# Patient Record
Sex: Female | Born: 2000 | Race: Black or African American | Hispanic: No | Marital: Single | Smoking: Never smoker
Health system: Southern US, Community
[De-identification: ages and names within clinical notes are randomized; demographics above are authoritative.]

---
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
## 2023-03-09 ENCOUNTER — Ambulatory Visit (LOCAL_COMMUNITY_HEALTH_CENTER): Payer: Self-pay

## 2023-03-09 DIAGNOSIS — Z111 Encounter for screening for respiratory tuberculosis: Secondary | ICD-10-CM

## 2023-03-12 ENCOUNTER — Ambulatory Visit (LOCAL_COMMUNITY_HEALTH_CENTER): Payer: 59

## 2023-03-12 ENCOUNTER — Encounter: Payer: Self-pay | Admitting: Obstetrics and Gynecology

## 2023-03-12 DIAGNOSIS — Z111 Encounter for screening for respiratory tuberculosis: Secondary | ICD-10-CM

## 2023-03-12 LAB — TB SKIN TEST
Induration: 0 mm
TB Skin Test: NEGATIVE

## 2023-06-17 ENCOUNTER — Telehealth: Payer: 59 | Admitting: Family

## 2023-06-17 DIAGNOSIS — J069 Acute upper respiratory infection, unspecified: Secondary | ICD-10-CM

## 2023-06-17 MED ORDER — FLUTICASONE PROPIONATE 50 MCG/ACT NA SUSP: 2.0000 | Freq: Every day | NASAL | 6 refills | Status: DC

## 2023-06-17 MED ORDER — BENZONATATE 100 MG PO CAPS: 100.0000 mg | ORAL_CAPSULE | Freq: Three times a day (TID) | ORAL | 0 refills | Status: AC | PRN

## 2023-06-17 NOTE — Patient Instructions (Signed)

## 2023-06-17 NOTE — Progress Notes (Signed)
 Virtual Visit Consent   Penny Ford Staff, you are scheduled for a virtual visit with a [facility] provider today. Just as with appointments in the office, your consent must be obtained to participate. Your consent will be active for this visit and any virtual visit you may have with one of our providers in the next 365 days. If you have a MyChart account, a copy of this consent can be sent to you electronically.  As this is a virtual visit, video technology does not allow for your provider to perform a traditional examination. This may limit your provider's ability to fully assess your condition. If your provider identifies any concerns that need to be evaluated in person or the need to arrange testing (such as labs, EKG, etc.), we will make arrangements to do so. Although advances in technology are sophisticated, we cannot ensure that it will always work on either your end or our end. If the connection with a video visit is poor, the visit may have to be switched to a telephone visit. With either a video or telephone visit, we are not always able to ensure that we have a secure connection.  By engaging in this virtual visit, you consent to the provision of healthcare and authorize for your insurance to be billed (if applicable) for the services provided during this visit. Depending on your insurance coverage, you may receive a charge related to this service.  I need to obtain your verbal consent now. Are you willing to proceed with your visit today? Penny Ford has provided verbal consent on 06/17/2023 for a virtual visit (video or telephone). Jannifer Rodney, FNP  Date: 06/17/2023 4:31 PM   Virtual Visit via Video Note   I, Jannifer Rodney, connected with  Penny Ford  ([id number], 2000/10/20) on 06/17/23 at  4:30 PM EST by a video-enabled telemedicine application and verified that I am speaking with the correct person using two identifiers.  Location: Patient: Virtual Visit Location  Patient: Home Provider: Virtual Visit Location Provider: Home Office   I discussed the limitations of evaluation and management by telemedicine and the availability of in person appointments. The patient expressed understanding and agreed to proceed.    History of Present Illness: Penny Ford is a 23 y.o. who identifies as a female who was assigned female at birth, and is being seen today for fatigue, cough, and light headed that started yesterday.  HPI: Cough This is a new problem. The current episode started yesterday. The problem has been unchanged. The problem occurs every few minutes. The cough is Non-productive. Associated symptoms include headaches, nasal congestion, a sore throat and shortness of breath. Pertinent negatives include no chills, ear congestion, ear pain, fever, myalgias or wheezing. She has tried rest and OTC cough suppressant for the symptoms. The treatment provided mild relief.    Problems:  Patient Active Problem List   Diagnosis Date Noted   Family history of ovarian cancer 04/01/2018    Allergies: No Known Allergies Medications:  Current Outpatient Medications:    benzonatate (TESSALON PERLES) 100 MG capsule, Take 1 capsule (100 mg total) by mouth 3 (three) times daily as needed., Disp: 20 capsule, Rfl: 0   fluticasone (FLONASE) 50 MCG/ACT nasal spray, Place 2 sprays into both nostrils daily., Disp: 16 g, Rfl: 6  Observations/Objective: Patient is well-developed, well-nourished in no acute distress.  Resting comfortably  at home.  Head is normocephalic, atraumatic.  No labored breathing.  Speech is clear and coherent with logical  content.  Patient is alert and oriented at baseline.  Dry nonproductive cough  Assessment and Plan: 1. Viral URI (Primary) - benzonatate (TESSALON PERLES) 100 MG capsule; Take 1 capsule (100 mg total) by mouth 3 (three) times daily as needed.  Dispense: 20 capsule; Refill: 0 - fluticasone (FLONASE) 50 MCG/ACT nasal spray;  Place 2 sprays into both nostrils daily.  Dispense: 16 g; Refill: 6  - Take meds as prescribed - Use a cool mist humidifier  -Use saline nose sprays frequently -Force fluids -For any cough or congestion  Use plain Mucinex- regular strength or max strength is fine -For fever or aces or pains- take tylenol or ibuprofen. -Throat lozenges if help -Follow up if symptoms worsen or do not improve   Follow Up Instructions: I discussed the assessment and treatment plan with the patient. The patient was provided an opportunity to ask questions and all were answered. The patient agreed with the plan and demonstrated an understanding of the instructions.  A copy of instructions were sent to the patient via MyChart unless otherwise noted below.     The patient was advised to call back or seek an in-person evaluation if the symptoms worsen or if the condition fails to improve as anticipated.    Jannifer Rodney, FNP

## 2023-08-12 ENCOUNTER — Telehealth: Admitting: Physician Assistant

## 2023-08-12 ENCOUNTER — Telehealth

## 2023-08-12 DIAGNOSIS — J069 Acute upper respiratory infection, unspecified: Secondary | ICD-10-CM

## 2023-08-12 DIAGNOSIS — Z5321 Procedure and treatment not carried out due to patient leaving prior to being seen by health care provider: Secondary | ICD-10-CM

## 2023-08-12 MED ORDER — CETIRIZINE-PSEUDOEPHEDRINE ER 5-120 MG PO TB12: 1.0000 | ORAL_TABLET | Freq: Two times a day (BID) | ORAL | 0 refills | Status: AC

## 2023-08-12 MED ORDER — FLUTICASONE PROPIONATE 50 MCG/ACT NA SUSP: 2.0000 | Freq: Every day | NASAL | 6 refills | Status: AC

## 2023-08-12 NOTE — Progress Notes (Signed)
 The patient no-showed for appointment despite this provider sending direct link, reaching out via phone with no response and waiting for at least 10 minutes from appointment time for patient to join. They will be marked as a NS for this appointment/time.   Laure Kidney, PA-C

## 2023-08-12 NOTE — Progress Notes (Signed)
 Virtual Visit Consent   Penny Ford, you are scheduled for a virtual visit with a [facility] provider today. Just as with appointments in the office, your consent must be obtained to participate. Your consent will be active for this visit and any virtual visit you may have with one of our providers in the next 365 days. If you have a MyChart account, a copy of this consent can be sent to you electronically.  As this is a virtual visit, video technology does not allow for your provider to perform a traditional examination. This may limit your provider's ability to fully assess your condition. If your provider identifies any concerns that need to be evaluated in person or the need to arrange testing (such as labs, EKG, etc.), we will make arrangements to do so. Although advances in technology are sophisticated, we cannot ensure that it will always work on either your end or our end. If the connection with a video visit is poor, the visit may have to be switched to a telephone visit. With either a video or telephone visit, we are not always able to ensure that we have a secure connection.  By engaging in this virtual visit, you consent to the provision of healthcare and authorize for your insurance to be billed (if applicable) for the services provided during this visit. Depending on your insurance coverage, you may receive a charge related to this service.  I need to obtain your verbal consent now. Are you willing to proceed with your visit today? Penny L Wurster has provided verbal consent on 08/12/2023 for a virtual visit (video or telephone). Penny Ford, [place]  Date: 08/12/2023 6:19 PM   Virtual Visit via Video Note   I, Penny Ford, connected with  ADDILYNNE OLHEISER  ([id number], [birth year]) on 08/12/23 at  6:15 PM EDT by a video-enabled telemedicine application and verified that I am speaking with the correct person using two identifiers.  Location: Patient: Virtual Visit Location  Patient: Home Provider: Virtual Visit Location Provider: Home Office   I discussed the limitations of evaluation and management by telemedicine and the availability of in person appointments. The patient expressed understanding and agreed to proceed.    History of Present Illness: Penny Ford is a 23 y.o. who identifies as a female who was assigned female at birth, and is being seen today for URI.  HPI: Sinus Problem This is a new problem. The current episode started yesterday. The problem is unchanged. There has been no fever. The pain is mild. Associated symptoms include congestion, headaches and sinus pressure. Past treatments include lying down. The treatment provided mild relief.    Problems:  Patient Active Problem List   Diagnosis Date Noted   Family history of ovarian cancer 04/01/2018    Allergies: No Known Allergies Medications:  Current Outpatient Medications:    benzonatate  (TESSALON  PERLES) 100 MG capsule, Take 1 capsule (100 mg total) by mouth 3 (three) times daily as needed., Disp: 20 capsule, Rfl: 0   fluticasone  (FLONASE ) 50 MCG/ACT nasal spray, Place 2 sprays into both nostrils daily., Disp: 16 g, Rfl: 6  Observations/Objective: Patient is well-developed, well-nourished in no acute distress.  Resting comfortably  at home.  Head is normocephalic, atraumatic.  No labored breathing.  Speech is clear and coherent with logical content.  Patient is alert and oriented at baseline.    Assessment and Plan: 1. Viral URI (Primary)    Patient presents with symptoms suspicious for viral URI.Differentials include bacterial  pneumonia, sinusitis, allergic rhinitis. Do not suspect underlying cardiopulmonary process. I considered, but think unlikely, dangerous causes of this patient's symptoms to include ACS, CHF or COPD exacerbations, pneumonia, pneumothorax. Patient is nontoxic appearing and not in need of emergent medical intervention.  Follow Up Instructions: I  discussed the assessment and treatment plan with the patient. The patient was provided an opportunity to ask questions and all were answered. The patient agreed with the plan and demonstrated an understanding of the instructions.  A copy of instructions were sent to the patient via MyChart unless otherwise noted below.     The patient was advised to call back or seek an in-person evaluation if the symptoms worsen or if the condition fails to improve as anticipated.    Penny Settle, PA-C

## 2023-08-12 NOTE — Patient Instructions (Signed)
  Penny Ford, thank you for joining Penny Settle, PA-C for today's virtual visit.  While this provider is not your primary care provider (PCP), if your PCP is located in our provider database this encounter information will be shared with them immediately following your visit.   A [facility] account gives you access to today's visit and all your visits, tests, and labs performed at [facility] " click here if you don't have a Penny Ford account or go to [email or web address]  Consent: (Patient) Penny Ford provided verbal consent for this virtual visit at the beginning of the encounter.  Current Medications:  Current Outpatient Medications:    benzonatate  (TESSALON  PERLES) 100 MG capsule, Take 1 capsule (100 mg total) by mouth 3 (three) times daily as needed., Disp: 20 capsule, Rfl: 0   fluticasone  (FLONASE ) 50 MCG/ACT nasal spray, Place 2 sprays into both nostrils daily., Disp: 16 g, Rfl: 6   Medications ordered in this encounter:  No orders of the defined types were placed in this encounter.    *If you need refills on other medications prior to your next appointment, please contact your pharmacy*  Follow-Up: Call back or seek an in-person evaluation if the symptoms worsen or if the condition fails to improve as anticipated.  [facility] [phone]  Other Instructions Please report to the nearest Emergency room with any worsening symptoms. Follow up with primary care provider (PCP) in 2 -3 days.    If you have been instructed to have an in-person evaluation today at a local Urgent Care facility, please use the link below. It will take you to a list of all of our available [facility], including address, phone number and hours of operation. Please do not delay care.  [name]  If you or a family member do not have a primary care provider, use the link below to schedule a visit and establish  care. When you choose a [facility] primary care physician or advanced practice provider, you gain a long-term partner in health. Find a Primary Care Provider  Learn more about [facility]'s in-office and virtual care options:  - Get Care Now

## 2023-09-13 ENCOUNTER — Encounter: Payer: Self-pay | Admitting: Nurse Practitioner

## 2023-09-13 ENCOUNTER — Telehealth

## 2023-09-13 ENCOUNTER — Telehealth: Admitting: Nurse Practitioner

## 2023-09-13 DIAGNOSIS — R42 Dizziness and giddiness: Secondary | ICD-10-CM | POA: Diagnosis not present

## 2023-09-13 DIAGNOSIS — R55 Syncope and collapse: Secondary | ICD-10-CM

## 2023-09-13 NOTE — Patient Instructions (Signed)
  Penny Ford, thank you for joining Collins Dean, NP for today's virtual visit.  While this provider is not your primary care provider (PCP), if your PCP is located in our provider database this encounter information will be shared with them immediately following your visit.   A [facility] MyChart account gives you access to today's visit and all your visits, tests, and labs performed at [facility] " click here if you don't have a [facility] MyChart account or go to [email or web address]  Consent: (Patient) Penny Ford provided verbal consent for this virtual visit at the beginning of the encounter.  Current Medications:  Current Outpatient Medications:    benzonatate  (TESSALON  PERLES) 100 MG capsule, Take 1 capsule (100 mg total) by mouth 3 (three) times daily as needed., Disp: 20 capsule, Rfl: 0   fluticasone  (FLONASE ) 50 MCG/ACT nasal spray, Place 2 sprays into both nostrils daily., Disp: 16 g, Rfl: 6   Medications ordered in this encounter:  No orders of the defined types were placed in this encounter.    *If you need refills on other medications prior to your next appointment, please contact your pharmacy*  Follow-Up: Call back or seek an in-person evaluation if the symptoms worsen or if the condition fails to improve as anticipated.  [facility] [phone]  Other Instructions Needs to establish with PCP for blood work, blood pressure monitoring and urinalysis   If you have been instructed to have an in-person evaluation today at a local Urgent Care facility, please use the link below. It will take you to a list of all of our available [facility], including address, phone number and hours of operation. Please do not delay care.  [facility]  If you or a family member do not have a primary care provider, use the link below to schedule a visit and establish care. When you choose a [facility] primary  care physician or advanced practice provider, you gain a long-term partner in health. Find a Primary Care Provider  Learn more about 's in-office and virtual care options:  - Get Care Now

## 2023-09-13 NOTE — Progress Notes (Signed)
 Virtual Visit Consent   Penny Ford, you are scheduled for a virtual visit with a [facility] provider today. Just as with appointments in the office, your consent must be obtained to participate. Your consent will be active for this visit and any virtual visit you may have with one of our providers in the next 365 days. If you have a MyChart account, a copy of this consent can be sent to you electronically.  As this is a virtual visit, video technology does not allow for your provider to perform a traditional examination. This may limit your provider's ability to fully assess your condition. If your provider identifies any concerns that need to be evaluated in person or the need to arrange testing (such as labs, EKG, etc.), we will make arrangements to do so. Although advances in technology are sophisticated, we cannot ensure that it will always work on either your end or our end. If the connection with a video visit is poor, the visit may have to be switched to a telephone visit. With either a video or telephone visit, we are not always able to ensure that we have a secure connection.  By engaging in this virtual visit, you consent to the provision of healthcare and authorize for your insurance to be billed (if applicable) for the services provided during this visit. Depending on your insurance coverage, you may receive a charge related to this service.  I need to obtain your verbal consent now. Are you willing to proceed with your visit today? Penny Ford has provided verbal consent on 09/13/2023 for a virtual visit (video or telephone). Collins Dean, NP  Date: 09/13/2023 10:52 AM   Virtual Visit via Video Note   I, Collins Dean, connected with  Penny Ford  ([id number], Feb 19, 2001) on 09/13/23 at 10:45 AM EDT by a video-enabled telemedicine application and verified that I am speaking with the correct person using two identifiers.  Location: Patient: Virtual Visit  Location Patient: Home Provider: Virtual Visit Location Provider: Home Office   I discussed the limitations of evaluation and management by telemedicine and the availability of in person appointments. The patient expressed understanding and agreed to proceed.    History of Present Illness: Penny Ford is a 23 y.o. who identifies as a female who was assigned female at birth, and is being seen today for presyncope.  Ms Swanner states for the past year she has been experiencing symptoms of lightheadedness when she first wakes up from lying to sitting or sitting to standing. She brushed her symptoms off because they usually only lasted for a few seconds.  However yesterday at work while she was standing she felt lightheaded as if she was going to pass out along with nausea and this lasted for 2 hours.  She states she was only standing when this occurred and it was not associated with any other activity.     Problems:  Patient Active Problem List   Diagnosis Date Noted   Family history of ovarian cancer 04/01/2018    Allergies: No Known Allergies Medications:  Current Outpatient Medications:    benzonatate  (TESSALON  PERLES) 100 MG capsule, Take 1 capsule (100 mg total) by mouth 3 (three) times daily as needed., Disp: 20 capsule, Rfl: 0   fluticasone  (FLONASE ) 50 MCG/ACT nasal spray, Place 2 sprays into both nostrils daily., Disp: 16 g, Rfl: 6  Observations/Objective: Patient is well-developed, well-nourished in no acute distress.  Resting comfortably  at home.  Head  is normocephalic, atraumatic.  No labored breathing.  Speech is clear and coherent with logical content.  Patient is alert and oriented at baseline.    Assessment and Plan: 1. Postural dizziness with presyncope (Primary) Needs to establish with PCP for blood work, blood pressure monitoring and urinalysis   Follow Up Instructions: I discussed the assessment and treatment plan with the patient. The patient was  provided an opportunity to ask questions and all were answered. The patient agreed with the plan and demonstrated an understanding of the instructions.  A copy of instructions were sent to the patient via MyChart unless otherwise noted below.   The patient was advised to call back or seek an in-person evaluation if the symptoms worsen or if the condition fails to improve as anticipated.    Rainier Feuerborn W Sadrac Zeoli, NP

## 2023-09-14 ENCOUNTER — Ambulatory Visit
Admission: RE | Admit: 2023-09-14 | Discharge: 2023-09-14 | Disposition: A | Discharge status: Home / Self Care | Source: Ambulatory Visit | Attending: Emergency Medicine | Admitting: Emergency Medicine

## 2023-09-14 VITALS — BP 118/84 | HR 82 | Temp 99.0°F | Resp 16

## 2023-09-14 DIAGNOSIS — R42 Dizziness and giddiness: Secondary | ICD-10-CM | POA: Diagnosis not present

## 2023-09-14 MED ORDER — MECLIZINE HCL 12.5 MG PO TABS: 12.5000 mg | ORAL_TABLET | Freq: Three times a day (TID) | ORAL | 0 refills | Status: AC | PRN

## 2023-09-14 NOTE — ED Provider Notes (Signed)
 [facility]    CSN: [id number] Arrival date & time: 09/14/23  0904      History   Chief Complaint Chief Complaint  Patient presents with   Dizziness    When I stand up sometimes,I get nauseous and dizzy then I feel like I'm about to pass out sometimes it only last for a minute but while I was at work it lasted for hours - Entered by patient    HPI Penny Ford is a 23 y.o. female.   Patient presents for evaluation of intermittent dizziness present for 2 years.  Described as feeling off balance as well as the room spinning, associated visual disturbance described as Penny Ford to sparkly spots and blackening vision.  Typically occurs independent of movement, last approximately 1 minute before spontaneous resolution.  2 days ago symptoms were brought on while at work, lasting for 2 hours before resolution, felt as if she was going to pass out but denies syncopal episode.  In the past has attempted use of iron pills which provided no relief.  Endorses no change in activity, diet, medications.  Endorses eating prior to prolonged events.  First evaluation.  History reviewed. No pertinent past medical history.  Patient Active Problem List   Diagnosis Date Noted   Family history of ovarian cancer 04/01/2018    History reviewed. No pertinent surgical history.  OB History   No obstetric history on file.      Home Medications    Prior to Admission medications   Medication Sig Start Date End Date Taking? Authorizing Provider  acyclovir (ZOVIRAX) 800 MG tablet Take 800 mg by mouth 5 (five) times daily.   Yes [provider]  meclizine (ANTIVERT) 12.5 MG tablet Take 1 tablet (12.5 mg total) by mouth 3 (three) times daily as needed for dizziness. 09/14/23  Yes Taylan Mayhan R, NP  benzonatate  (TESSALON  PERLES) 100 MG capsule Take 1 capsule (100 mg total) by mouth 3 (three) times daily as needed. 06/17/23   Yevette Hem, FNP  fluticasone  (FLONASE ) 50 MCG/ACT nasal  spray Place 2 sprays into both nostrils daily. 08/12/23   [name], PA-C    Family History Family History  Problem Relation Age of Onset   Diabetes Mother    Cancer Maternal Grandmother        ovarian   Breast cancer Neg Hx     Social History Social History   Tobacco Use   Smoking status: Never   Smokeless tobacco: Never  Vaping Use   Vaping status: Never Used  Substance Use Topics   Alcohol use: Never   Drug use: Never     Allergies   Patient has no known allergies.   Review of Systems Review of Systems   Physical Exam Triage Vital Signs ED Triage Vitals  Encounter Vitals Group     BP 09/14/23 0940 118/84     Systolic BP Percentile --      Diastolic BP Percentile --      Pulse Rate 09/14/23 0940 82     Resp 09/14/23 0940 16     Temp 09/14/23 0940 99 F (37.2 C)     Temp Source 09/14/23 0940 Oral     SpO2 09/14/23 0940 98 %     Weight --      Height --      Head Circumference --      Peak Flow --      Pain Score 09/14/23 0941 0     Pain  Loc --      Pain Education --      Exclude from Growth Chart --    Orthostatic VS for the past 24 hrs:  BP- Lying Pulse- Lying BP- Sitting Pulse- Sitting BP- Standing at 0 minutes Pulse- Standing at 0 minutes  09/14/23 0945 111/73 79 115/75 92 131/84 108    Updated Vital Signs BP 118/84 (BP Location: Left Arm)   Pulse 82   Temp 99 F (37.2 C) (Oral)   Resp 16   LMP 08/31/2023 (Exact Date)   SpO2 98%   Visual Acuity Right Eye Distance:   Left Eye Distance:   Bilateral Distance:    Right Eye Near:   Left Eye Near:    Bilateral Near:     Physical Exam Constitutional:      Appearance: Normal appearance.  HENT:     Head: Normocephalic.     Right Ear: Tympanic membrane, ear canal and external ear normal.     Left Ear: Tympanic membrane, ear canal and external ear normal.  Eyes:     Extraocular Movements: Extraocular movements intact.     Conjunctiva/sclera: Conjunctivae normal.     Pupils: Pupils  are equal, round, and reactive to light.  Cardiovascular:     Rate and Rhythm: Normal rate and regular rhythm.     Pulses: Normal pulses.     Heart sounds: Normal heart sounds.  Pulmonary:     Effort: Pulmonary effort is normal.     Breath sounds: Normal breath sounds.  Neurological:     General: No focal deficit present.     Mental Status: She is alert and oriented to person, place, and time. Mental status is at baseline.     Cranial Nerves: No cranial nerve deficit.     Sensory: No sensory deficit.     Motor: No weakness.     Coordination: Coordination normal.     Gait: Gait normal.      UC Treatments / Results  Labs (all labs ordered are listed, but only abnormal results are displayed) Labs Reviewed  BASIC METABOLIC PANEL WITH GFR  CBC    EKG   Radiology No results found.  Procedures Procedures (including critical care time)  Medications Ordered in UC Medications - No data to display  Initial Impression / Assessment and Plan / UC Course  I have reviewed the triage vital signs and the nursing notes.  Pertinent labs & imaging results that were available during my care of the patient were reviewed by me and considered in my medical decision making (see chart for details).  Dizziness  Vital signs are stable, patient in no signs of distress nontoxic-appearing, no neurological deficits on exam, no abnormality to the ear, S1 and S2 heard to auscultation, EKG shows normal sinus rhythm, orthostatic vitals show elevation in blood pressure and heart rate when changing from a standing to a seated position, concerning for POTS, CMP and CBC pending, prescribed meclizine for supportive treatment, recommended safety measures and schedule follow-up appointment with PCP for further evaluation and management Final Clinical Impressions(s) / UC Diagnoses   Final diagnoses:  Dizziness   Discharge Instructions      Your evaluated for your dizziness, unknown cause at this time but  do have concern of positional dizziness  EKG shows that heart is beating in a normal pace and rhythm  orthostatic vital signs show that your blood pressure and heart rate increased when you change positions which could be concerning for positional tachycardia,  will require more workup for formal diagnosis therefore please schedule appointment with your primary doctor  Lab work checking your electrolytes and blood levels are pending, you will be notified of any concerning values  Dehydration and fatigue will exacerbate drinking denies  Ensure that you are drinking adequate water throughout the day, as recommended eight 8 ounce glasses of water, may also use electrolyte replacement drinks such as Gatorade or similar products  Ensure that you are getting rest throughout the night  Change positions slowly waiting at least 10 seconds between movements to help stabilize the body  If dizziness is present or worsens you yourself to a seated position to prevent falls  You may use meclizine every 8 hours as needed to help calm symptoms  Please schedule follow-up appointment with your primary doctor for further evaluation and management  ED Prescriptions     Medication Sig Dispense Auth. Provider   meclizine (ANTIVERT) 12.5 MG tablet Take 1 tablet (12.5 mg total) by mouth 3 (three) times daily as needed for dizziness. 30 tablet Arelly Whittenberg R, NP      PDMP not reviewed this encounter.   Reena Canning, NP 09/14/23 1018

## 2023-09-14 NOTE — ED Triage Notes (Signed)
 Pt states she gets dizzy when she stands and it normally last for a min, that she states has been going for 2 or more years. Pt states when she went to work Wednesday and stood up she felt dizzy for 2 hours before it went away.   Pt states she started taking iron supplements 3 months ago thinking that may help but no relief of symptoms.

## 2023-09-14 NOTE — ED Notes (Signed)
 Set patient up with new PCP at [facility].

## 2023-09-14 NOTE — Discharge Instructions (Addendum)
 Your evaluated for your dizziness, unknown cause at this time but do have concern of positional dizziness  EKG shows that heart is beating in a normal pace and rhythm  orthostatic vital signs show that your blood pressure and heart rate increased when you change positions which could be concerning for positional tachycardia, will require more workup for formal diagnosis therefore please schedule appointment with your primary doctor  Lab work checking your electrolytes and blood levels are pending, you will be notified of any concerning values  Dehydration and fatigue will exacerbate drinking denies  Ensure that you are drinking adequate water throughout the day, as recommended eight 8 ounce glasses of water, may also use electrolyte replacement drinks such as Gatorade or similar products  Ensure that you are getting rest throughout the night  Change positions slowly waiting at least 10 seconds between movements to help stabilize the body  If dizziness is present or worsens you yourself to a seated position to prevent falls  You may use meclizine every 8 hours as needed to help calm symptoms  Please schedule follow-up appointment with your primary doctor for further evaluation and management

## 2023-09-15 ENCOUNTER — Ambulatory Visit: Payer: Self-pay | Admitting: Emergency Medicine

## 2023-09-15 LAB — BASIC METABOLIC PANEL WITH GFR
BUN/Creatinine Ratio: 13 (ref 9–23)
BUN: 8 mg/dL (ref 6–20)
CO2: 20 mmol/L (ref 20–29)
Calcium: 8.8 mg/dL (ref 8.7–10.2)
Chloride: 106 mmol/L (ref 96–106)
Creatinine, Ser: 0.63 mg/dL (ref 0.57–1.00)
Glucose: 91 mg/dL (ref 70–99)
Potassium: 4.5 mmol/L (ref 3.5–5.2)
Sodium: 143 mmol/L (ref 134–144)
eGFR: 129 mL/min/{1.73_m2} (ref >59)

## 2023-09-15 LAB — CBC
Hematocrit: 41.4 % (ref 34.0–46.6)
Hemoglobin: 12.6 g/dL (ref 11.1–15.9)
MCH: 26.1 pg — ABNORMAL LOW (ref 26.6–33.0)
MCHC: 30.4 g/dL — ABNORMAL LOW (ref 31.5–35.7)
MCV: 86 fL (ref 79–97)
Platelets: 330 10*3/uL (ref 150–450)
RBC: 4.83 x10E6/uL (ref 3.77–5.28)
RDW: 15.2 % (ref 11.7–15.4)
WBC: 7.7 10*3/uL (ref 3.4–10.8)

## 2023-11-12 ENCOUNTER — Ambulatory Visit: Admitting: Family Medicine

## 2023-12-20 ENCOUNTER — Telehealth: Admitting: Nurse Practitioner

## 2023-12-20 DIAGNOSIS — N944 Primary dysmenorrhea: Secondary | ICD-10-CM

## 2023-12-20 MED ORDER — IBUPROFEN 800 MG PO TABS: 800.0000 mg | ORAL_TABLET | Freq: Three times a day (TID) | ORAL | 0 refills | Status: AC | PRN

## 2023-12-20 NOTE — Progress Notes (Signed)
 Virtual Visit Consent   Penny Ford, you are scheduled for a virtual visit with a [facility] provider today. Just as with appointments in the office, your consent must be obtained to participate. Your consent will be active for this visit and any virtual visit you may have with one of our providers in the next 365 days. If you have a MyChart account, a copy of this consent can be sent to you electronically.  As this is a virtual visit, video technology does not allow for your provider to perform a traditional examination. This may limit your provider's ability to fully assess your condition. If your provider identifies any concerns that need to be evaluated in person or the need to arrange testing (such as labs, EKG, etc.), we will make arrangements to do so. Although advances in technology are sophisticated, we cannot ensure that it will always work on either your end or our end. If the connection with a video visit is poor, the visit may have to be switched to a telephone visit. With either a video or telephone visit, we are not always able to ensure that we have a secure connection.  By engaging in this virtual visit, you consent to the provision of healthcare and authorize for your insurance to be billed (if applicable) for the services provided during this visit. Depending on your insurance coverage, you may receive a charge related to this service.  I need to obtain your verbal consent now. Are you willing to proceed with your visit today? Penny Ford has provided verbal consent on 12/20/2023 for a virtual visit (video or telephone). Penny LELON Servant, NP  Date: 12/20/2023 5:21 PM   Virtual Visit via Video Note   I, Penny Ford, connected with  Penny Ford  ([id number], Jul 31, 2000) on 12/20/23 at  5:15 PM EDT by a video-enabled telemedicine application and verified that I am speaking with the correct person using two identifiers.  Location: Patient: Virtual Visit Location Patient:  Home Provider: Virtual Visit Location Provider: Home Office   I discussed the limitations of evaluation and management by telemedicine and the availability of in person appointments. The patient expressed understanding and agreed to proceed.    History of Present Illness: Penny Ford is a 23 y.o. who identifies as a female who was assigned female at birth, and is being seen today for dysmenorrhea   Penny Ford is currently experiencing painful uterine cramping, diarrhea and back pain related to the onset of her menstrual cycle.  She is currently taking over-the-counter Motrin  for pain with no relief..   Problems:  Patient Active Problem List   Diagnosis Date Noted   Family history of ovarian cancer 04/01/2018    Allergies: No Known Allergies Medications:  Current Outpatient Medications:    ibuprofen  (ADVIL ) 800 MG tablet, Take 1 tablet (800 mg total) by mouth every 8 (eight) hours as needed for moderate pain (pain score 4-6) or cramping., Disp: 60 tablet, Rfl: 0   acyclovir (ZOVIRAX) 800 MG tablet, Take 800 mg by mouth 5 (five) times daily., Disp: , Rfl:    benzonatate  (TESSALON  PERLES) 100 MG capsule, Take 1 capsule (100 mg total) by mouth 3 (three) times daily as needed., Disp: 20 capsule, Rfl: 0   fluticasone  (FLONASE ) 50 MCG/ACT nasal spray, Place 2 sprays into both nostrils daily., Disp: 16 g, Rfl: 6   meclizine  (ANTIVERT ) 12.5 MG tablet, Take 1 tablet (12.5 mg total) by mouth 3 (three) times daily as needed for dizziness., Disp: 30  tablet, Rfl: 0  Observations/Objective: Patient is well-developed, well-nourished in no acute distress.  Resting comfortably at home.  Head is normocephalic, atraumatic.  No labored breathing.  Speech is clear and coherent with logical content.  Patient is alert and oriented at baseline.    Assessment and Plan: 1. Primary dysmenorrhea (Primary) - ibuprofen  (ADVIL ) 800 MG tablet; Take 1 tablet (800 mg total) by mouth every 8 (eight) hours as  needed for moderate pain (pain score 4-6) or cramping.  Dispense: 60 tablet; Refill: 0    Follow Up Instructions: I discussed the assessment and treatment plan with the patient. The patient was provided an opportunity to ask questions and all were answered. The patient agreed with the plan and demonstrated an understanding of the instructions.  A copy of instructions were sent to the patient via MyChart unless otherwise noted below.    The patient was advised to call back or seek an in-person evaluation if the symptoms worsen or if the condition fails to improve as anticipated.    Penny Zahm W Zen Cedillos, NP

## 2023-12-20 NOTE — Patient Instructions (Signed)
  Penny Ford, thank you for joining Haze LELON Servant, NP for today's virtual visit.  While this provider is not your primary care provider (PCP), if your PCP is located in our provider database this encounter information will be shared with them immediately following your visit.   A [facility] MyChart account gives you access to today's visit and all your visits, tests, and labs performed at [facility]  click here if you don't have a [facility] MyChart account or go to [email or web address]  Consent: (Patient) Penny Ford provided verbal consent for this virtual visit at the beginning of the encounter.  Current Medications:  Current Outpatient Medications:    ibuprofen  (ADVIL ) 800 MG tablet, Take 1 tablet (800 mg total) by mouth every 8 (eight) hours as needed for moderate pain (pain score 4-6) or cramping., Disp: 60 tablet, Rfl: 0   acyclovir (ZOVIRAX) 800 MG tablet, Take 800 mg by mouth 5 (five) times daily., Disp: , Rfl:    benzonatate  (TESSALON  PERLES) 100 MG capsule, Take 1 capsule (100 mg total) by mouth 3 (three) times daily as needed., Disp: 20 capsule, Rfl: 0   fluticasone  (FLONASE ) 50 MCG/ACT nasal spray, Place 2 sprays into both nostrils daily., Disp: 16 g, Rfl: 6   meclizine  (ANTIVERT ) 12.5 MG tablet, Take 1 tablet (12.5 mg total) by mouth 3 (three) times daily as needed for dizziness., Disp: 30 tablet, Rfl: 0   Medications ordered in this encounter:  Meds ordered this encounter  Medications   ibuprofen  (ADVIL ) 800 MG tablet    Sig: Take 1 tablet (800 mg total) by mouth every 8 (eight) hours as needed for moderate pain (pain score 4-6) or cramping.    Dispense:  60 tablet    Refill:  0    Supervising Provider:   BLAISE ALEENE KIDD [[id number]]     *If you need refills on other medications prior to your next appointment, please contact your pharmacy*  Follow-Up: Call back or seek an in-person evaluation if the symptoms worsen or if the condition fails  to improve as anticipated.  [facility] [phone]  Other Instructions May apply heating pad to abdomen for relief pain   If you have been instructed to have an in-person evaluation today at a local Urgent Care facility, please use the link below. It will take you to a list of all of our available [facility], including address, phone number and hours of operation. Please do not delay care.  [facility]  If you or a family member do not have a primary care provider, use the link below to schedule a visit and establish care. When you choose a [facility] primary care physician or advanced practice provider, you gain a long-term partner in health. Find a Primary Care Provider  Learn more about [facility]'s in-office and virtual care options:  - Get Care Now

## 2023-12-26 ENCOUNTER — Ambulatory Visit: Admitting: Family Medicine

## 2024-02-24 ENCOUNTER — Telehealth: Admitting: Family

## 2024-02-24 DIAGNOSIS — N944 Primary dysmenorrhea: Secondary | ICD-10-CM | POA: Diagnosis not present

## 2024-02-24 NOTE — Progress Notes (Signed)
 Virtual Visit Consent   Penny Ford, you are scheduled for a virtual visit with a [facility] provider today. Just as with appointments in the office, your consent must be obtained to participate. Your consent will be active for this visit and any virtual visit you may have with one of our providers in the next 365 days. If you have a MyChart account, a copy of this consent can be sent to you electronically.  As this is a virtual visit, video technology does not allow for your provider to perform a traditional examination. This may limit your provider's ability to fully assess your condition. If your provider identifies any concerns that need to be evaluated in person or the need to arrange testing (such as labs, EKG, etc.), we will make arrangements to do so. Although advances in technology are sophisticated, we cannot ensure that it will always work on either your end or our end. If the connection with a video visit is poor, the visit may have to be switched to a telephone visit. With either a video or telephone visit, we are not always able to ensure that we have a secure connection.  By engaging in this virtual visit, you consent to the provision of healthcare and authorize for your insurance to be billed (if applicable) for the services provided during this visit. Depending on your insurance coverage, you may receive a charge related to this service.  I need to obtain your verbal consent now. Are you willing to proceed with your visit today? Penny Ford has provided verbal consent on 02/24/2024 for a virtual visit (video or telephone). Bari Learn, FNP  Date: 02/24/2024 7:34 PM   Virtual Visit via Video Note   I, Bari Learn, connected with  Penny Ford  ([id number], 14-Jun-2000) on 02/24/24 at  7:30 PM EST by a video-enabled telemedicine application and verified that I am speaking with the correct person using two identifiers.  Location: Patient: Virtual Visit Location Patient:  Home Provider: Virtual Visit Location Provider: Home Office   I discussed the limitations of evaluation and management by telemedicine and the availability of in person appointments. The patient expressed understanding and agreed to proceed.    History of Present Illness: Penny Ford is a 23 y.o. who identifies as a female who was assigned female at birth, and is being seen today for dysmenorrhea that this AM. Reports abdominal cramping of 8.5 out 10. She reports using 5 pads a day.   States she is also having diarrhea.   States she can not go to work currently.   She has been taking ibuprofen  with mild relief.   HPI: HPI  Problems:  Patient Active Problem List   Diagnosis Date Noted   Family history of ovarian cancer 04/01/2018    Allergies: No Known Allergies Medications:  Current Outpatient Medications:    acyclovir (ZOVIRAX) 800 MG tablet, Take 800 mg by mouth 5 (five) times daily., Disp: , Rfl:    benzonatate  (TESSALON  PERLES) 100 MG capsule, Take 1 capsule (100 mg total) by mouth 3 (three) times daily as needed., Disp: 20 capsule, Rfl: 0   fluticasone  (FLONASE ) 50 MCG/ACT nasal spray, Place 2 sprays into both nostrils daily., Disp: 16 g, Rfl: 6   ibuprofen  (ADVIL ) 800 MG tablet, Take 1 tablet (800 mg total) by mouth every 8 (eight) hours as needed for moderate pain (pain score 4-6) or cramping., Disp: 60 tablet, Rfl: 0   meclizine  (ANTIVERT ) 12.5 MG tablet, Take 1 tablet (12.5 mg total)  by mouth 3 (three) times daily as needed for dizziness., Disp: 30 tablet, Rfl: 0  Observations/Objective: Patient is well-developed, well-nourished in no acute distress.  Resting comfortably at home.  Head is normocephalic, atraumatic.  No labored breathing. Speech is clear and coherent with logical content.  Patient is alert and oriented at baseline.    Assessment and Plan: 1. Primary dysmenorrhea (Primary)  Continue with ibuprofen   Heating pad Encourage exercise  Work note given   Follow up if symptoms worsen or do not improve    Follow Up Instructions: I discussed the assessment and treatment plan with the patient. The patient was provided an opportunity to ask questions and all were answered. The patient agreed with the plan and demonstrated an understanding of the instructions.  A copy of instructions were sent to the patient via MyChart unless otherwise noted below.     The patient was advised to call back or seek an in-person evaluation if the symptoms worsen or if the condition fails to improve as anticipated.    Bari Learn, FNP

## 2024-02-24 NOTE — Patient Instructions (Signed)
 Cramps During Your Menstrual Period Cramps during your period is called dysmenorrhea. The cramps cause pain in your lower belly. In some cases, the pain may not be that bad. In other cases, the pain may be so bad that it can affect your daily life for a few days each month. There're two types of this condition: Primary. This happens when a female has their first monthly period or soon after. As a person gets older or has a baby, the cramps usually lessen or go away. Secondary. This type begins later in life. It's caused by a problem in the reproductive system. This type lasts longer. The pain may also be worse than in primary type. What are the causes? Primary dysmenorrhea is caused by hormone changes during your period.  Common causes of secondary dysmenorrhea include: Problems with the tissue that lines the uterus. This tissue may: Grow outside the uterus. Grow into the walls of the uterus. Growths in the uterus that are not cancer (fibroids). Problems with the reproductive organs. These may include problems with the uterus, fallopian tubes, or ovaries. Infection. What increases the risk? You are more likely to get this condition if: You are younger than 21 years old. You had your first period before you were 21 years old. You have irregular or heavy bleeding. You have never given birth. You have family members with the condition. You smoke or use nicotine products. You have high body weight or a low body weight. What are the signs or symptoms? Symptoms of this condition may include: Cramps and pain in the lower belly or lower back. Headaches. Throwing up or feeling like you may throw up. Watery poop (diarrhea) or loose poop. Feeling dizzy. Feeling tired. How is this diagnosed? This condition may be diagnosed based on your symptoms, medical history, and a physical exam. You may also have other tests, including: Imaging tests, such as ultrasound, X-ray, CT scan, or MRI. Blood  tests. A pregnancy test. A pap test. Taking out and testing a small piece of the tissue that lines the uterus. A procedure that uses a device with a camera to look inside the uterus, pelvis, or bladder. How is this treated? Treatment depends on the cause of your condition. You may be given: Medicines for pain. Hormones. These include shots to stop your periods. You may also get hormones through birth control pills or an IUD. Antidepressant medicines. Antibiotics. These are given if your cramps are being caused by an infection. Other treatment may include: Exercise and physical therapy. Meditation, yoga, and acupuncture. Stimulating the nerves that go to the bottom of the spine. If other treatments don't work, your health care provider may suggest having surgery. This is rare. Work with your provider to see which treatment is best for you. Follow these instructions at home: Relieving pain and cramping  Use heat as told. Put the heat on your lower back or belly when you have pain or cramps. Use the heat source that your provider recommends, such as a moist heat pack or a heating pad. Do this as often as told. Place a towel between your skin and the heat source. Leave the heat on for 20-30 minutes. If your skin turns red, take off the heat right away to prevent burns. The risk of burns is higher if you can't feel pain, heat, or cold. Do not sleep with a heating pad on. Exercise as told. Activities such as walking, swimming, or biking can help to relieve pain. Massage your lower back  or belly to help relieve pain. General instructions Take your medicines only as told. Ask your provider if it's safe to drive or use machines while taking your medicine. Avoid alcohol and caffeine during and right before your period. These can make cramps worse. Do not smoke, vape, or use nicotine or tobacco. Keep all follow-up visits. Your provider can change your treatment plan if treatment isn't  working. Contact a health care provider if: You have symptoms that get worse or do not get better with medicine. You have new symptoms. This information is not intended to replace advice given to you by your health care provider. Make sure you discuss any questions you have with your health care provider. Document Revised: 02/20/2023 Document Reviewed: 10/03/2022 Elsevier Patient Education  2024 [employer].

## 2024-04-15 ENCOUNTER — Telehealth: Admitting: Physician Assistant

## 2024-04-15 DIAGNOSIS — J069 Acute upper respiratory infection, unspecified: Secondary | ICD-10-CM | POA: Diagnosis not present

## 2024-04-15 NOTE — Patient Instructions (Signed)
" °  Derick Ina, thank you for joining Vegas Coffin, PA-C for today's virtual visit.  While this provider is not your primary care provider (PCP), if your PCP is located in our provider database this encounter information will be shared with them immediately following your visit.   A [facility] MyChart account gives you access to today's visit and all your visits, tests, and labs performed at [facility]  click here if you don't have a [facility] MyChart account or go to [email or web address]  Consent: (Patient) Penny Ford provided verbal consent for this virtual visit at the beginning of the encounter.  Current Medications:  Current Outpatient Medications:    acyclovir (ZOVIRAX) 800 MG tablet, Take 800 mg by mouth 5 (five) times daily., Disp: , Rfl:    benzonatate  (TESSALON  PERLES) 100 MG capsule, Take 1 capsule (100 mg total) by mouth 3 (three) times daily as needed., Disp: 20 capsule, Rfl: 0   fluticasone  (FLONASE ) 50 MCG/ACT nasal spray, Place 2 sprays into both nostrils daily., Disp: 16 g, Rfl: 6   ibuprofen  (ADVIL ) 800 MG tablet, Take 1 tablet (800 mg total) by mouth every 8 (eight) hours as needed for moderate pain (pain score 4-6) or cramping., Disp: 60 tablet, Rfl: 0   meclizine  (ANTIVERT ) 12.5 MG tablet, Take 1 tablet (12.5 mg total) by mouth 3 (three) times daily as needed for dizziness., Disp: 30 tablet, Rfl: 0   Medications ordered in this encounter:  No orders of the defined types were placed in this encounter.    *If you need refills on other medications prior to your next appointment, please contact your pharmacy*  Follow-Up: Call back or seek an in-person evaluation if the symptoms worsen or if the condition fails to improve as anticipated.  [facility] [phone]  Other Instructions    If you have been instructed to have an in-person evaluation today at a local Urgent Care facility, please use the link below. It will take you  to a list of all of our available [facility], including address, phone number and hours of operation. Please do not delay care.  [facility]  If you or a family member do not have a primary care provider, use the link below to schedule a visit and establish care. When you choose a [facility] primary care physician or advanced practice provider, you gain a long-term partner in health. Find a Primary Care Provider  Learn more about [facility]'s in-office and virtual care options: [facility] - Get Care Now  "

## 2024-04-15 NOTE — Progress Notes (Signed)
 " Virtual Visit Consent   Penny Ford, you are scheduled for a virtual visit with a [facility] provider today. Just as with appointments in the office, your consent must be obtained to participate. Your consent will be active for this visit and any virtual visit you may have with one of our providers in the next 365 days. If you have a MyChart account, a copy of this consent can be sent to you electronically.  As this is a virtual visit, video technology does not allow for your provider to perform a traditional examination. This may limit your provider's ability to fully assess your condition. If your provider identifies any concerns that need to be evaluated in person or the need to arrange testing (such as labs, EKG, etc.), we will make arrangements to do so. Although advances in technology are sophisticated, we cannot ensure that it will always work on either your end or our end. If the connection with a video visit is poor, the visit may have to be switched to a telephone visit. With either a video or telephone visit, we are not always able to ensure that we have a secure connection.  By engaging in this virtual visit, you consent to the provision of healthcare and authorize for your insurance to be billed (if applicable) for the services provided during this visit. Depending on your insurance coverage, you may receive a charge related to this service.  I need to obtain your verbal consent now. Are you willing to proceed with your visit today? Penny Ford has provided verbal consent on 04/15/2024 for a virtual visit (video or telephone). Penny Putzier, PA-C  Date: 04/15/2024 12:04 PM   Virtual Visit via Video Note   I, Penny Ford, connected with  Penny Ford  ([id number], Aug 09, 2000) on 04/15/2024 at 12:00 PM EST by a video-enabled telemedicine application and verified that I am speaking with the correct person using two identifiers.  Location: Patient: Virtual Visit Location  Patient: Home Provider: Virtual Visit Location Provider: Home Office   I discussed the limitations of evaluation and management by telemedicine and the availability of in person appointments. The patient expressed understanding and agreed to proceed.    History of Present Illness: Penny Ford is a 23 y.o. who identifies as a female who was assigned female at birth, and is being seen today for cold symptoms.  HPI: Patient presents for evaluation of cough and sore throat since Sunday.  Reports mild intermittent cough and some congestion.  She has tried over-the-counter cold medication with some relief also took some cough drops which have been helpful.  She denies any sick contacts at home, no concern for COVID or flu.  Denies any fever, chills, nausea, vomiting, chest pain, shortness of breath.  Patient is requesting a work note for today.    Problems:  Patient Active Problem List   Diagnosis Date Noted   Family history of ovarian cancer 04/01/2018    Allergies: Allergies[1] Medications: Current Medications[2]  Observations/Objective: Patient is well-developed, well-nourished in no acute distress.  Resting comfortably  at home.  Head is normocephalic, atraumatic.  No labored breathing.  Speech is clear and coherent with logical content.  Patient is alert and oriented at baseline.    Assessment and Plan: 1. Acute URI (Primary)   Get rest and adequate sleep  Drink plenty of water, broth, and other clear fluids to stay hydrated.  Use a cool-mist humidifier or take steamy showers to relieve congestion.  Elevate the head of the  bed to help with post nasal drainage Sip warm liquids, gargle with salt water, use lozenges, or suck on hard candy.  Use over-the-counter medications like acetaminophen (Tylenol) or ibuprofen  (Advil , Motrin ) as needed for fever and pain Honey cough drops can help alleviate cough symptoms.  Use saline nasal sprays or washes.  Over the counter mucinex, max  strength ( blue and white box) to help loosen sinus congestion.  Please to the emergency room if any new or worsening symptoms  Please seek an in-person evaluation if the symptoms worsen or if the condition fails to improve as anticipated.  PCP follow-up in 5-7 days   Follow Up Instructions: I discussed the assessment and treatment plan with the patient. The patient was provided an opportunity to ask questions and all were answered. The patient agreed with the plan and demonstrated an understanding of the instructions.  A copy of instructions were sent to the patient via MyChart unless otherwise noted below.    The patient was advised to call back or seek an in-person evaluation if the symptoms worsen or if the condition fails to improve as anticipated.    Penny Lukas, PA-C    [1] No Known Allergies [2]  Current Outpatient Medications:    acyclovir (ZOVIRAX) 800 MG tablet, Take 800 mg by mouth 5 (five) times daily., Disp: , Rfl:    benzonatate  (TESSALON  PERLES) 100 MG capsule, Take 1 capsule (100 mg total) by mouth 3 (three) times daily as needed., Disp: 20 capsule, Rfl: 0   fluticasone  (FLONASE ) 50 MCG/ACT nasal spray, Place 2 sprays into both nostrils daily., Disp: 16 g, Rfl: 6   ibuprofen  (ADVIL ) 800 MG tablet, Take 1 tablet (800 mg total) by mouth every 8 (eight) hours as needed for moderate pain (pain score 4-6) or cramping., Disp: 60 tablet, Rfl: 0   meclizine  (ANTIVERT ) 12.5 MG tablet, Take 1 tablet (12.5 mg total) by mouth 3 (three) times daily as needed for dizziness., Disp: 30 tablet, Rfl: 0  "
